# Patient Record
Sex: Male | Born: 1950 | Race: White | Hispanic: No | Marital: Single | Smoking: Never smoker
Health system: Southern US, Community
[De-identification: ages and names within clinical notes are randomized; demographics above are authoritative.]

## PROBLEM LIST (undated history)

## (undated) DIAGNOSIS — E119 Type 2 diabetes mellitus without complications: Secondary | ICD-10-CM

## (undated) DIAGNOSIS — C801 Malignant (primary) neoplasm, unspecified: Secondary | ICD-10-CM

---

## 2015-09-23 DIAGNOSIS — Z794 Long term (current) use of insulin: Secondary | ICD-10-CM

## 2015-09-23 DIAGNOSIS — E7800 Pure hypercholesterolemia, unspecified: Secondary | ICD-10-CM | POA: Insufficient documentation

## 2015-09-23 DIAGNOSIS — E118 Type 2 diabetes mellitus with unspecified complications: Secondary | ICD-10-CM | POA: Insufficient documentation

## 2015-09-23 DIAGNOSIS — I10 Essential (primary) hypertension: Secondary | ICD-10-CM | POA: Insufficient documentation

## 2016-06-22 ENCOUNTER — Ambulatory Visit
Admission: RE | Admit: 2016-06-22 | Discharge: 2016-06-22 | Disposition: A | Discharge status: Home / Self Care | Payer: Medicare Other | Source: Ambulatory Visit | Attending: Physician Assistant | Admitting: Physician Assistant

## 2016-06-22 ENCOUNTER — Other Ambulatory Visit: Payer: Self-pay | Admitting: Physician Assistant

## 2016-06-22 DIAGNOSIS — K862 Cyst of pancreas: Secondary | ICD-10-CM | POA: Insufficient documentation

## 2016-06-22 DIAGNOSIS — R945 Abnormal results of liver function studies: Secondary | ICD-10-CM

## 2016-06-22 DIAGNOSIS — K869 Disease of pancreas, unspecified: Secondary | ICD-10-CM

## 2016-06-22 DIAGNOSIS — R7989 Other specified abnormal findings of blood chemistry: Secondary | ICD-10-CM

## 2016-06-22 DIAGNOSIS — R17 Unspecified jaundice: Secondary | ICD-10-CM | POA: Diagnosis present

## 2016-06-22 DIAGNOSIS — I700 Atherosclerosis of aorta: Secondary | ICD-10-CM | POA: Insufficient documentation

## 2016-06-22 DIAGNOSIS — K7689 Other specified diseases of liver: Secondary | ICD-10-CM | POA: Diagnosis not present

## 2016-06-22 DIAGNOSIS — R918 Other nonspecific abnormal finding of lung field: Secondary | ICD-10-CM | POA: Diagnosis not present

## 2016-06-22 DIAGNOSIS — K8020 Calculus of gallbladder without cholecystitis without obstruction: Secondary | ICD-10-CM | POA: Insufficient documentation

## 2016-06-22 HISTORY — DX: Type 2 diabetes mellitus without complications: E11.9

## 2016-06-22 MED ORDER — IOPAMIDOL (ISOVUE-300) INJECTION 61%
100.0000 mL | Freq: Once | INTRAVENOUS | Status: AC | PRN
  Administered 2016-06-22: 100 mL via INTRAVENOUS

## 2016-06-26 ENCOUNTER — Encounter
Admission: RE | Admit: 2016-06-26 | Discharge: 2016-06-26 | Disposition: A | Discharge status: Home / Self Care | Payer: Medicare Other | Source: Ambulatory Visit | Attending: Physician Assistant | Admitting: Physician Assistant

## 2016-06-26 DIAGNOSIS — K869 Disease of pancreas, unspecified: Secondary | ICD-10-CM | POA: Insufficient documentation

## 2016-06-27 ENCOUNTER — Encounter: Admission: RE | Admit: 2016-06-27 | Payer: Medicare Other | Source: Ambulatory Visit

## 2016-06-27 LAB — GLUCOSE, CAPILLARY: Glucose-Capillary: 54 mg/dL — ABNORMAL LOW (ref 65–99)

## 2016-07-02 ENCOUNTER — Emergency Department
Admission: EM | Admit: 2016-07-02 | Discharge: 2016-07-02 | Disposition: A | Discharge status: Home / Self Care | Payer: Medicare Other | Attending: Emergency Medicine | Admitting: Emergency Medicine

## 2016-07-02 ENCOUNTER — Encounter: Payer: Self-pay | Admitting: Emergency Medicine

## 2016-07-02 ENCOUNTER — Other Ambulatory Visit: Payer: Self-pay | Admitting: Internal Medicine

## 2016-07-02 DIAGNOSIS — Z7984 Long term (current) use of oral hypoglycemic drugs: Secondary | ICD-10-CM | POA: Insufficient documentation

## 2016-07-02 DIAGNOSIS — R739 Hyperglycemia, unspecified: Secondary | ICD-10-CM

## 2016-07-02 DIAGNOSIS — Z794 Long term (current) use of insulin: Secondary | ICD-10-CM | POA: Diagnosis not present

## 2016-07-02 DIAGNOSIS — K869 Disease of pancreas, unspecified: Secondary | ICD-10-CM

## 2016-07-02 DIAGNOSIS — E1165 Type 2 diabetes mellitus with hyperglycemia: Secondary | ICD-10-CM | POA: Diagnosis not present

## 2016-07-02 HISTORY — DX: Malignant (primary) neoplasm, unspecified: C80.1

## 2016-07-02 LAB — URINALYSIS, COMPLETE (UACMP) WITH MICROSCOPIC
Glucose, UA: 50 mg/dL — AB
Hgb urine dipstick: NEGATIVE
KETONES UR: NEGATIVE mg/dL
Leukocytes, UA: NEGATIVE
Nitrite: NEGATIVE
PH: 5 (ref 5.0–8.0)
Protein, ur: NEGATIVE mg/dL
RBC / HPF: NONE SEEN RBC/hpf (ref 0–5)
Specific Gravity, Urine: 1.018 (ref 1.005–1.030)
Squamous Epithelial / LPF: NONE SEEN

## 2016-07-02 LAB — CBC
HEMATOCRIT: 32.9 % — AB (ref 40.0–52.0)
Hemoglobin: 11.5 g/dL — ABNORMAL LOW (ref 13.0–18.0)
MCH: 31.1 pg (ref 26.0–34.0)
MCHC: 35.1 g/dL (ref 32.0–36.0)
MCV: 88.5 fL (ref 80.0–100.0)
PLATELETS: 225 10*3/uL (ref 150–440)
RBC: 3.71 MIL/uL — ABNORMAL LOW (ref 4.40–5.90)
RDW: 15.8 % — AB (ref 11.5–14.5)
WBC: 8.8 10*3/uL (ref 3.8–10.6)

## 2016-07-02 LAB — BASIC METABOLIC PANEL
Anion gap: 10 (ref 5–15)
BUN: 25 mg/dL — AB (ref 6–20)
CHLORIDE: 98 mmol/L — AB (ref 101–111)
CO2: 23 mmol/L (ref 22–32)
CREATININE: UNDETERMINED mg/dL (ref 0.61–1.24)
Calcium: 9 mg/dL (ref 8.9–10.3)
Glucose, Bld: 362 mg/dL — ABNORMAL HIGH (ref 65–99)
Potassium: 4 mmol/L (ref 3.5–5.1)
Sodium: 131 mmol/L — ABNORMAL LOW (ref 135–145)

## 2016-07-02 LAB — GLUCOSE, CAPILLARY
GLUCOSE-CAPILLARY: 328 mg/dL — AB (ref 65–99)
GLUCOSE-CAPILLARY: 287 mg/dL — AB (ref 65–99)
GLUCOSE-CAPILLARY: 275 mg/dL — AB (ref 65–99)

## 2016-07-02 MED ORDER — INSULIN ASPART PROT & ASPART (70-30 MIX) 100 UNIT/ML ~~LOC~~ SUSP
20.0000 [IU] | SUBCUTANEOUS | Status: AC
  Administered 2016-07-02: 20 [IU] via SUBCUTANEOUS
  Filled 2016-07-02: qty 20

## 2016-07-02 MED ORDER — SODIUM CHLORIDE 0.9 % IV BOLUS (SEPSIS)
1000.0000 mL | INTRAVENOUS | Status: AC
  Administered 2016-07-02: 1000 mL via INTRAVENOUS

## 2016-07-02 NOTE — ED Triage Notes (Signed)
Pt reports recently diagnosed with pancreatic cancer. Pt reports had been having problems with hypoglycemia but now his blood sugar has been over 400. Pt is scheduled for PET scan tomorrow. Pt reports his sugar needs to be regulated so that the scan can be done.

## 2016-07-02 NOTE — ED Notes (Signed)
BS 287

## 2016-07-02 NOTE — ED Provider Notes (Signed)
[facility] Emergency Department Provider Note  ____________________________________________   First MD Initiated Contact with Patient 07/02/16 1923     (approximate)  I have reviewed the triage vital signs and the nursing notes.   HISTORY  Chief Complaint Hyperglycemia    HPI Roger Swanson is a 66 y.o. male with recent diagnosis of probable pancreatic cancer who presents for evaluation of hyperglycemia.  He received a diagnosis about a week ago but has been unable so far to get the outpatient PET scan due to glycemic control issues.  He is scheduled for tomorrow at noon, but his blood sugar today has been steadily rising and topped out in more than 400.  They are concerned that once again he will be unable to get the study if his blood sugar is too high so they came to the emergency department for evaluation after contacting his primary care doctor's office, Dr. Doy Hutching.  He has gradually been increasingly weak and fatigued over the last week.  He has no appetite.  He denies fever/chills, chest pain, shortness of breath, vomiting.  Occasionally has some abdominal discomfort and nausea.  His symptoms are moderate in severity.  Nothing in particular makes the patient's symptoms better nor worse.    He reports that he takes 70/30 insulin and that he used to take a dose in the evenings, but he was having issues with hypoglycemia so Dr. Doy Hutching took him off the evening dose.  This is the first time he has been having issues with hyperglycemia.  He takes 40 units of 70/30 in the morning.  Today when he woke up his blood sugar was about 227.  Without eating anything it went up at least 50 points within a couple of hours and then later went up to greater than 400.   Past Medical History:  Diagnosis Date  . Cancer ([place])   . Diabetes mellitus without complication ([place])     There are no active problems to display for this patient.   No past surgical history on  file.  Prior to Admission medications   Medication Sig Start Date End Date Taking? Authorizing Provider  insulin NPH-regular Human (NOVOLIN 70/30) (70-30) 100 UNIT/ML injection Inject 45 Units into the skin 2 (two) times daily before a meal. 09/27/15  Yes [provider]  metFORMIN (GLUCOPHAGE) 500 MG tablet Take 500 mg by mouth 2 (two) times daily. 04/05/16  Yes [provider]  omeprazole (PRILOSEC) 40 MG capsule Take 40 mg by mouth daily. 04/03/16  Yes [provider]    Allergies Patient has no known allergies.  No family history on file.  Social History Social History  Substance Use Topics  . Smoking status: Not on file  . Smokeless tobacco: Not on file  . Alcohol use Not on file    Review of Systems Constitutional: No fever/chills.  Hyperglycemia.  General malaise and generalized weakness Eyes: No visual changes. ENT: No sore throat. Cardiovascular: Denies chest pain. Respiratory: Denies shortness of breath. Gastrointestinal: Occasional abdominal discomfort with nausea, no vomiting.  No diarrhea.  No constipation.  Minimal appetite. Genitourinary: Negative for dysuria. Musculoskeletal: Negative for neck pain.  Negative for back pain. Integumentary: Jaundice.  Negative for rash. Neurological: Negative for headaches, focal weakness or numbness.   ____________________________________________   PHYSICAL EXAM:  VITAL SIGNS: ED Triage Vitals  Enc Vitals Group     BP 07/02/16 1720 (!) 162/72     Pulse Rate 07/02/16 1720 (!) 101  Resp 07/02/16 1720 18     Temp 07/02/16 1720 98 F (36.7 C)     Temp Source 07/02/16 1720 Oral     SpO2 07/02/16 1720 100 %     Weight 07/02/16 1721 91.6 kg (202 lb)     Height 07/02/16 1721 1.854 m (6\' 1" )     Head Circumference --      Peak Flow --      Pain Score --      Pain Loc --      Pain Edu? --      Excl. in [place]? --     Constitutional: Alert and oriented. Obvious jaundice but in no acute  distress Eyes: Scleral icterus Head: Atraumatic. Cardiovascular: Normal rate, regular rhythm. Good peripheral circulation.  Respiratory: Normal respiratory effort.  No retractions.  Musculoskeletal: No lower extremity tenderness nor edema. No gross deformities of extremities. Neurologic:  Normal speech and language. No gross focal neurologic deficits are appreciated.  Skin:  Skin is warm, dry and intact. No rash noted. Psychiatric: Mood and affect are normal. Speech and behavior are normal.  ____________________________________________   LABS (all labs ordered are listed, but only abnormal results are displayed)  Labs Reviewed  BASIC METABOLIC PANEL - Abnormal; Notable for the following:       Result Value   Sodium 131 (*)    Chloride 98 (*)    Glucose, Bld 362 (*)    BUN 25 (*)    All other components within normal limits  CBC - Abnormal; Notable for the following:    RBC 3.71 (*)    Hemoglobin 11.5 (*)    HCT 32.9 (*)    RDW 15.8 (*)    All other components within normal limits  URINALYSIS, COMPLETE (UACMP) WITH MICROSCOPIC - Abnormal; Notable for the following:    Color, Urine AMBER (*)    APPearance HAZY (*)    Glucose, UA 50 (*)    Bilirubin Urine MODERATE (*)    Bacteria, UA RARE (*)    All other components within normal limits  GLUCOSE, CAPILLARY - Abnormal; Notable for the following:    Glucose-Capillary 328 (*)    All other components within normal limits  GLUCOSE, CAPILLARY - Abnormal; Notable for the following:    Glucose-Capillary 287 (*)    All other components within normal limits  GLUCOSE, CAPILLARY - Abnormal; Notable for the following:    Glucose-Capillary 275 (*)    All other components within normal limits  CBG MONITORING, ED   ____________________________________________  EKG  None - EKG not ordered by ED physician ____________________________________________  RADIOLOGY   No results  found.  ____________________________________________   PROCEDURES  Critical Care performed: No   Procedure(s) performed:   Procedures   ____________________________________________   INITIAL IMPRESSION / ASSESSMENT AND PLAN / ED COURSE  Pertinent labs & imaging results that were available during my care of the patient were reviewed by me and considered in my medical decision making (see chart for details).  The patient has no acute or emergent complications of hyperglycemia based on his lab work.  I verified with radiology that it is true that they cannot perform a PET scan with a blood sugar more than the upper 200s, and lower 200s is better because of the glycemic analogue used for the tracer.  Radiologist also verified that in general PET scan's are not performed as an inpatient study so bring him into the hospital for sliding scale insulin would not be advisable.  Regardless, he does not meet any inpatient criteria.  I will discuss this situation by phone with Dr. Doy Hutching for additional management recommendations, but I will suggest a liter of IV fluids and a smaller, perhaps one half regular dose of his 70/30 insulin to try to optimize him for tomorrow study.   Clinical Course as of Jul 03 2322  Mon Jul 02, 2016  2000 Discussed by phone with Dr. Doy Hutching.  He agreed with my plan for some insulin tonight and IV fluids.  He recommended 20 units of 70/30 insulin in addition to the liter of IV fluids.  He provided a number at which the patient and family can contact him at 7:45 AM tomorrow.  The patient has until 8 AM to take additional insulin to control his blood sugar and Dr. Doy Hutching will be sure and be available tomorrow to work with them to try to make sure he is able to get his PET scan.  I updated the patient and his family and they agree with this plan and are comfortable with that.  [CF]    Clinical Course User Index [CF] Hinda Kehr, MD     ____________________________________________  FINAL CLINICAL IMPRESSION(S) / ED DIAGNOSES  Final diagnoses:  Hyperglycemia     MEDICATIONS GIVEN DURING THIS VISIT:  Medications  sodium chloride 0.9 % bolus 1,000 mL (0 mLs Intravenous Stopped 07/02/16 2220)  insulin aspart protamine- aspart (NOVOLOG MIX 70/30) injection 20 Units (20 Units Subcutaneous Given 07/02/16 2128)     NEW OUTPATIENT MEDICATIONS STARTED DURING THIS VISIT:  Discharge Medication List as of 07/02/2016 10:01 PM      Discharge Medication List as of 07/02/2016 10:01 PM      Discharge Medication List as of 07/02/2016 10:01 PM       Note:  This document was prepared using Dragon voice recognition software and may include unintentional dictation errors.    Hinda Kehr, MD 07/02/16 2324

## 2016-07-02 NOTE — ED Notes (Signed)
Pt states that he needs to get his blood sugar under control. Has a PET scan tomm and BS needs to be lower. Family at bedside.

## 2016-07-02 NOTE — ED Notes (Signed)
BS 275

## 2016-07-02 NOTE — Discharge Instructions (Signed)
As we discussed, though your blood sugar is running high, it is not dangerous at this time.  We understand the concern, however, that an elevated blood glucose level may prevent you from getting your PET scan tomorrow.  As we discussed, Dr. Doy Hutching and I came up with a plan for you to receive half of your regular dose of 70/30 insulin tonight.  Check your fingerstick blood glucose level in the morning and call Dr. Doy Hutching at the number listed above at about 7:45 AM.  He will work with you based on your morning number to determine if you need any additional insulin or other treatment.

## 2016-07-02 NOTE — ED Notes (Signed)
Pt signed E-Signature.

## 2016-07-03 ENCOUNTER — Encounter
Admission: RE | Admit: 2016-07-03 | Discharge: 2016-07-03 | Disposition: A | Discharge status: Home / Self Care | Payer: Medicare Other | Source: Ambulatory Visit | Attending: Physician Assistant | Admitting: Physician Assistant

## 2016-07-03 DIAGNOSIS — K869 Disease of pancreas, unspecified: Secondary | ICD-10-CM

## 2016-07-03 LAB — GLUCOSE, CAPILLARY: Glucose-Capillary: 160 mg/dL — ABNORMAL HIGH (ref 65–99)

## 2016-07-03 MED ORDER — FLUDEOXYGLUCOSE F - 18 (FDG) INJECTION
12.9700 | Freq: Once | INTRAVENOUS | Status: AC | PRN
  Administered 2016-07-03: 12.97 via INTRAVENOUS

## 2016-07-04 ENCOUNTER — Other Ambulatory Visit: Payer: Self-pay | Admitting: *Deleted

## 2016-07-05 DIAGNOSIS — K8689 Other specified diseases of pancreas: Secondary | ICD-10-CM | POA: Insufficient documentation
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
## 2016-07-06 ENCOUNTER — Inpatient Hospital Stay: Payer: Medicare Other | Attending: Oncology | Admitting: Oncology

## 2016-07-06 ENCOUNTER — Encounter: Payer: Self-pay | Admitting: Oncology

## 2016-07-06 ENCOUNTER — Inpatient Hospital Stay: Payer: Medicare Other

## 2016-07-06 ENCOUNTER — Telehealth: Payer: Self-pay

## 2016-07-06 ENCOUNTER — Other Ambulatory Visit: Payer: Self-pay

## 2016-07-06 VITALS — BP 129/79 | HR 99 | Temp 97.0°F | Ht 73.0 in | Wt 207.7 lb

## 2016-07-06 DIAGNOSIS — Z794 Long term (current) use of insulin: Secondary | ICD-10-CM | POA: Insufficient documentation

## 2016-07-06 DIAGNOSIS — C7800 Secondary malignant neoplasm of unspecified lung: Secondary | ICD-10-CM | POA: Insufficient documentation

## 2016-07-06 DIAGNOSIS — E119 Type 2 diabetes mellitus without complications: Secondary | ICD-10-CM | POA: Diagnosis not present

## 2016-07-06 DIAGNOSIS — K869 Disease of pancreas, unspecified: Secondary | ICD-10-CM | POA: Insufficient documentation

## 2016-07-06 DIAGNOSIS — K8689 Other specified diseases of pancreas: Secondary | ICD-10-CM

## 2016-07-06 DIAGNOSIS — Z79899 Other long term (current) drug therapy: Secondary | ICD-10-CM | POA: Insufficient documentation

## 2016-07-06 DIAGNOSIS — C779 Secondary and unspecified malignant neoplasm of lymph node, unspecified: Secondary | ICD-10-CM | POA: Diagnosis not present

## 2016-07-06 DIAGNOSIS — R978 Other abnormal tumor markers: Secondary | ICD-10-CM | POA: Insufficient documentation

## 2016-07-06 DIAGNOSIS — C252 Malignant neoplasm of tail of pancreas: Secondary | ICD-10-CM | POA: Insufficient documentation

## 2016-07-06 DIAGNOSIS — R188 Other ascites: Secondary | ICD-10-CM | POA: Insufficient documentation

## 2016-07-06 DIAGNOSIS — K8020 Calculus of gallbladder without cholecystitis without obstruction: Secondary | ICD-10-CM | POA: Insufficient documentation

## 2016-07-06 DIAGNOSIS — I700 Atherosclerosis of aorta: Secondary | ICD-10-CM | POA: Insufficient documentation

## 2016-07-06 DIAGNOSIS — C787 Secondary malignant neoplasm of liver and intrahepatic bile duct: Secondary | ICD-10-CM | POA: Diagnosis not present

## 2016-07-06 DIAGNOSIS — C259 Malignant neoplasm of pancreas, unspecified: Secondary | ICD-10-CM | POA: Diagnosis not present

## 2016-07-06 DIAGNOSIS — Z7189 Other specified counseling: Secondary | ICD-10-CM

## 2016-07-06 LAB — CBC WITH DIFFERENTIAL/PLATELET
Basophils Absolute: 0.1 10*3/uL (ref 0–0.1)
Basophils Relative: 1 %
EOS PCT: 5 %
Eosinophils Absolute: 0.6 10*3/uL (ref 0–0.7)
HCT: 36.8 % — ABNORMAL LOW (ref 40.0–52.0)
Hemoglobin: 12.4 g/dL — ABNORMAL LOW (ref 13.0–18.0)
LYMPHS ABS: 0.8 10*3/uL — AB (ref 1.0–3.6)
LYMPHS PCT: 6 %
MCH: 29.8 pg (ref 26.0–34.0)
MCHC: 33.6 g/dL (ref 32.0–36.0)
MCV: 88.7 fL (ref 80.0–100.0)
MONO ABS: 0.8 10*3/uL (ref 0.2–1.0)
Monocytes Relative: 6 %
Neutro Abs: 10.5 10*3/uL — ABNORMAL HIGH (ref 1.4–6.5)
Neutrophils Relative %: 82 %
Platelets: 294 10*3/uL (ref 150–440)
RBC: 4.15 MIL/uL — ABNORMAL LOW (ref 4.40–5.90)
RDW: 16.6 % — AB (ref 11.5–14.5)
WBC: 12.8 10*3/uL — ABNORMAL HIGH (ref 3.8–10.6)

## 2016-07-06 LAB — COMPREHENSIVE METABOLIC PANEL
ALT: 125 U/L — AB (ref 17–63)
AST: 167 U/L — ABNORMAL HIGH (ref 15–41)
Albumin: 2.6 g/dL — ABNORMAL LOW (ref 3.5–5.0)
Alkaline Phosphatase: 638 U/L — ABNORMAL HIGH (ref 38–126)
Anion gap: 10 (ref 5–15)
BILIRUBIN TOTAL: 27.1 mg/dL — AB (ref 0.3–1.2)
BUN: 24 mg/dL — ABNORMAL HIGH (ref 6–20)
CHLORIDE: 103 mmol/L (ref 101–111)
CO2: 22 mmol/L (ref 22–32)
Calcium: 9 mg/dL (ref 8.9–10.3)
Glucose, Bld: 187 mg/dL — ABNORMAL HIGH (ref 65–99)
POTASSIUM: 4.1 mmol/L (ref 3.5–5.1)
Sodium: 135 mmol/L (ref 135–145)
TOTAL PROTEIN: 7.6 g/dL (ref 6.5–8.1)

## 2016-07-06 MED ORDER — ONDANSETRON HCL 8 MG PO TABS: 8.0000 mg | ORAL_TABLET | Freq: Two times a day (BID) | ORAL | 2 refills | Status: AC

## 2016-07-06 MED ORDER — LIDOCAINE-PRILOCAINE 2.5-2.5 % EX CREA: 1.0000 "application " | TOPICAL_CREAM | CUTANEOUS | 3 refills | Status: DC | PRN

## 2016-07-06 MED ORDER — PROCHLORPERAZINE MALEATE 10 MG PO TABS: 10.0000 mg | ORAL_TABLET | Freq: Four times a day (QID) | ORAL | 2 refills | Status: DC | PRN

## 2016-07-06 MED ORDER — ALPRAZOLAM 0.25 MG PO TABS: 0.2500 mg | ORAL_TABLET | Freq: Every evening | ORAL | 2 refills | Status: AC | PRN

## 2016-07-06 NOTE — Telephone Encounter (Signed)
  Oncology Nurse Navigator Documentation Notified Roger Swanson about GI appointment at [facility] Monday, 6/18, at 1:00pm. Notified of 27.1 Bilirubin and per Dr. Grayland Ormond if he has any new onset of symptoms, including but not limited to increased confusion, fatigue, drowsiness, abdominal pain to go to the ED right away. Verbalized understanding. Navigator Location: [facility] (07/06/16 1200)   )Navigator Encounter Type: Telephone;Diagnostic Results (07/06/16 1200)                                                    Time Spent with Patient: 15 (07/06/16 1200)

## 2016-07-06 NOTE — Progress Notes (Signed)
Patient here for initial consult he has expressed anxiety over diagnosis and treatment.

## 2016-07-06 NOTE — Progress Notes (Signed)
  Oncology Nurse Navigator Documentation Received call back from [facility]. They do not currently have a physician that performs ERCP. Dr. Grayland Ormond notified and referral changed to [facility]. Voicemail left with [facility] to return call regarding. Order placed. Judy notified. Navigator Location: (P) [facility] (07/06/16 1300)   )Navigator Encounter Type: (P) Telephone (07/06/16 1300)

## 2016-07-06 NOTE — Progress Notes (Signed)
START OFF PATHWAY REGIMEN - Pancreatic   OFF00015:Gemcitabine 1,000 mg/m2 Days 1, 8, 15 q28 Days:   A cycle is every 28 days (3 weeks on and 1 week off):     Gemcitabine   **Always confirm dose/schedule in your pharmacy ordering system**    Patient Characteristics: Adenocarcinoma, Metastatic Disease, First Line, PS = 0, 1 Histology: Adenocarcinoma Current evidence of distant metastases? Yes AJCC T Category: TX AJCC N Category: NX AJCC M Category: M1 AJCC 8 Stage Grouping: IV Line of therapy: First Line Would you be surprised if this patient died  in the next year? I would NOT be surprised if this patient died in the next year Intent of Therapy: Non-Curative / Palliative Intent, Discussed with Patient

## 2016-07-06 NOTE — Progress Notes (Signed)
  Oncology Nurse Navigator Documentation Met with Roger Swanson and his family during and after consult with Dr. Grayland Ormond. Introduced Therapist, nutritional and provided my contact information for future needs. Plan of care includes U/S guided liver biopsy, chemo class, port a cath, GI referral and labs today.  Navigator Location: [facility] (07/06/16 0900)   )Navigator Encounter Type: Initial MedOnc (07/06/16 0900)                     Patient Visit Type: MedOnc;Initial (07/06/16 0900) Treatment Phase: Pre-Tx/Tx Discussion (07/06/16 0900) Barriers/Navigation Needs: No barriers at this time;Education (07/06/16 0900)                Acuity: Level 2 (07/06/16 0900)   Acuity Level 2: Initial guidance, education and coordination as needed;Educational needs;Ongoing guidance and education throughout treatment as needed;Assistance expediting appointments (07/06/16 0900)     Time Spent with Patient: 60 (07/06/16 0900)

## 2016-07-07 LAB — CANCER ANTIGEN 19-9: CA 19 9: 28287 U/mL — AB (ref 0–35)

## 2016-07-09 ENCOUNTER — Encounter: Payer: Self-pay | Admitting: Gastroenterology

## 2016-07-09 ENCOUNTER — Encounter: Payer: Self-pay | Admitting: *Deleted

## 2016-07-09 ENCOUNTER — Other Ambulatory Visit: Payer: Self-pay

## 2016-07-09 ENCOUNTER — Ambulatory Visit (INDEPENDENT_AMBULATORY_CARE_PROVIDER_SITE_OTHER): Payer: Medicare Other | Admitting: Gastroenterology

## 2016-07-09 ENCOUNTER — Other Ambulatory Visit: Payer: Self-pay | Admitting: *Deleted

## 2016-07-09 ENCOUNTER — Telehealth: Payer: Self-pay

## 2016-07-09 VITALS — BP 130/66 | HR 97 | Temp 97.7°F | Ht 73.0 in | Wt 209.5 lb

## 2016-07-09 DIAGNOSIS — E113599 Type 2 diabetes mellitus with proliferative diabetic retinopathy without macular edema, unspecified eye: Secondary | ICD-10-CM | POA: Insufficient documentation

## 2016-07-09 DIAGNOSIS — K869 Disease of pancreas, unspecified: Secondary | ICD-10-CM

## 2016-07-09 DIAGNOSIS — K8689 Other specified diseases of pancreas: Secondary | ICD-10-CM
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
## 2016-07-09 NOTE — Telephone Encounter (Signed)
Ok, thank you. Keep me posted.

## 2016-07-09 NOTE — Telephone Encounter (Signed)
  Oncology Nurse Navigator Documentation Received call from friend Bethena Roys. Mr. Gasper is scheduled now for ERCP with Dr. Allen Norris 6-19. We will reschedule chemo class from 6/19 to likely 6/20. She would like to cancel port a cath for this week as she feels he is to weak to undergo all of the scheduled procedures. I will notify Dr. Grayland Ormond regarding. She will keep biopsy for 6/21 as scheduled. Navigator Location: [facility] (07/09/16 1600)   )Navigator Encounter Type: Telephone (07/09/16 1600)                                                    Time Spent with Patient: 15 (07/09/16 1600)

## 2016-07-10 ENCOUNTER — Ambulatory Visit: Payer: Medicare Other | Admitting: Anesthesiology

## 2016-07-10 ENCOUNTER — Telehealth: Payer: Self-pay

## 2016-07-10 ENCOUNTER — Other Ambulatory Visit (INDEPENDENT_AMBULATORY_CARE_PROVIDER_SITE_OTHER): Payer: Self-pay | Admitting: Vascular Surgery

## 2016-07-10 ENCOUNTER — Encounter
Admission: RE | Disposition: A | Discharge status: Home / Self Care | Payer: Self-pay | Source: Ambulatory Visit | Attending: Gastroenterology

## 2016-07-10 ENCOUNTER — Inpatient Hospital Stay: Payer: Medicare Other

## 2016-07-10 ENCOUNTER — Ambulatory Visit
Admission: RE | Admit: 2016-07-10 | Discharge: 2016-07-10 | Disposition: A | Discharge status: Home / Self Care | Payer: Medicare Other | Source: Ambulatory Visit | Attending: Gastroenterology | Admitting: Gastroenterology

## 2016-07-10 ENCOUNTER — Encounter: Payer: Self-pay | Admitting: Anesthesiology

## 2016-07-10 ENCOUNTER — Other Ambulatory Visit: Payer: Self-pay | Admitting: General Surgery

## 2016-07-10 ENCOUNTER — Ambulatory Visit: Payer: Medicare Other

## 2016-07-10 DIAGNOSIS — Z794 Long term (current) use of insulin: Secondary | ICD-10-CM | POA: Diagnosis not present

## 2016-07-10 DIAGNOSIS — K269 Duodenal ulcer, unspecified as acute or chronic, without hemorrhage or perforation: Secondary | ICD-10-CM | POA: Diagnosis not present

## 2016-07-10 DIAGNOSIS — E119 Type 2 diabetes mellitus without complications: Secondary | ICD-10-CM | POA: Insufficient documentation

## 2016-07-10 DIAGNOSIS — C259 Malignant neoplasm of pancreas, unspecified: Secondary | ICD-10-CM | POA: Insufficient documentation

## 2016-07-10 DIAGNOSIS — K2980 Duodenitis without bleeding: Secondary | ICD-10-CM | POA: Insufficient documentation

## 2016-07-10 DIAGNOSIS — K8689 Other specified diseases of pancreas: Secondary | ICD-10-CM

## 2016-07-10 DIAGNOSIS — K831 Obstruction of bile duct: Secondary | ICD-10-CM | POA: Diagnosis not present

## 2016-07-10 HISTORY — PX: ERCP: SHX5425

## 2016-07-10 LAB — GLUCOSE, CAPILLARY
GLUCOSE-CAPILLARY: 40 mg/dL — AB (ref 65–99)
GLUCOSE-CAPILLARY: 63 mg/dL — AB (ref 65–99)
GLUCOSE-CAPILLARY: 95 mg/dL (ref 65–99)
GLUCOSE-CAPILLARY: 94 mg/dL (ref 65–99)

## 2016-07-10 SURGERY — ERCP, WITH INTERVENTION IF INDICATED
Anesthesia: General

## 2016-07-10 MED ORDER — MIDAZOLAM HCL 2 MG/2ML IJ SOLN
INTRAMUSCULAR | Status: DC | PRN
Start: 2016-07-10 — End: 2016-07-10
  Administered 2016-07-10: 1 mg via INTRAVENOUS

## 2016-07-10 MED ORDER — DEXTROSE 50 % IV SOLN
25.0000 mL | Freq: Once | INTRAVENOUS | Status: AC
  Administered 2016-07-10: 25 mL via INTRAVENOUS

## 2016-07-10 MED ORDER — DEXTROSE 50 % IV SOLN
INTRAVENOUS | Status: AC
  Administered 2016-07-10: 25 mL via INTRAVENOUS
  Filled 2016-07-10: qty 50

## 2016-07-10 MED ORDER — LIDOCAINE HCL (PF) 1 % IJ SOLN
2.0000 mL | Freq: Once | INTRAMUSCULAR | Status: AC
  Administered 2016-07-10: 0.3 mL via INTRADERMAL
  Filled 2016-07-10: qty 2

## 2016-07-10 MED ORDER — FENTANYL CITRATE (PF) 100 MCG/2ML IJ SOLN
INTRAMUSCULAR | Status: DC | PRN
  Administered 2016-07-10: 25 ug via INTRAVENOUS

## 2016-07-10 MED ORDER — FENTANYL CITRATE (PF) 100 MCG/2ML IJ SOLN
INTRAMUSCULAR | Status: AC
  Filled 2016-07-10: qty 2

## 2016-07-10 MED ORDER — GLUCAGON HCL RDNA (DIAGNOSTIC) 1 MG IJ SOLR
INTRAMUSCULAR | Status: AC
  Filled 2016-07-10: qty 1

## 2016-07-10 MED ORDER — MIDAZOLAM HCL 2 MG/2ML IJ SOLN
INTRAMUSCULAR | Status: AC
  Filled 2016-07-10: qty 2

## 2016-07-10 MED ORDER — PROPOFOL 10 MG/ML IV BOLUS
INTRAVENOUS | Status: DC | PRN
  Administered 2016-07-10: 60 mg via INTRAVENOUS

## 2016-07-10 MED ORDER — LIDOCAINE HCL (PF) 2 % IJ SOLN
INTRAMUSCULAR | Status: AC
  Filled 2016-07-10: qty 2

## 2016-07-10 MED ORDER — SODIUM CHLORIDE 0.9 % IV SOLN
INTRAVENOUS | Status: DC
  Administered 2016-07-10: 1000 mL via INTRAVENOUS

## 2016-07-10 MED ORDER — PROPOFOL 500 MG/50ML IV EMUL
INTRAVENOUS | Status: DC | PRN
  Administered 2016-07-10: 160 ug/kg/min via INTRAVENOUS

## 2016-07-10 MED ORDER — PROPOFOL 500 MG/50ML IV EMUL
INTRAVENOUS | Status: AC
  Filled 2016-07-10: qty 50

## 2016-07-10 MED ORDER — GLYCOPYRROLATE 0.2 MG/ML IJ SOLN
INTRAMUSCULAR | Status: DC | PRN
  Administered 2016-07-10: 0.2 mg via INTRAVENOUS

## 2016-07-10 MED ORDER — GLUCAGON HCL (RDNA) 1 MG IJ SOLR
INTRAMUSCULAR | Status: DC | PRN
  Administered 2016-07-10: 1 mg via INTRAVENOUS

## 2016-07-10 MED ORDER — INDOMETHACIN 50 MG RE SUPP
100.0000 mg | Freq: Once | RECTAL | Status: AC
  Administered 2016-07-10: 100 mg via RECTAL

## 2016-07-10 MED ORDER — LIDOCAINE HCL (CARDIAC) 20 MG/ML IV SOLN
INTRAVENOUS | Status: DC | PRN
  Administered 2016-07-10: 80 mg via INTRAVENOUS

## 2016-07-10 NOTE — Op Note (Signed)
[facility] Gastroenterology Patient Name: Roger Swanson Procedure Date: 07/10/2016 12:58 PM MRN: [id number] Account #: [id number] Date of Birth: April 05, 1950 Admit Type: Outpatient Age: 66 Room: [facility] ENDO ROOM 4 Gender: Male Note Status: Finalized Procedure:            ERCP Indications:          Jaundice Providers:            Lucilla Lame MD, MD Referring MD:         Kathlene November. Grayland Ormond, MD (Referring MD) Medicines:            Propofol per Anesthesia Complications:        No immediate complications. Procedure:            Pre-Anesthesia Assessment:                       - Prior to the procedure, a History and Physical was                        performed, and patient medications and allergies were                        reviewed. The patient's tolerance of previous                        anesthesia was also reviewed. The risks and benefits of                        the procedure and the sedation options and risks were                        discussed with the patient. All questions were                        answered, and informed consent was obtained. Prior                        Anticoagulants: The patient has taken no previous                        anticoagulant or antiplatelet agents. ASA Grade                        Assessment: II - A patient with mild systemic disease.                        After reviewing the risks and benefits, the patient was                        deemed in satisfactory condition to undergo the                        procedure.                       After obtaining informed consent, the scope was passed                        under direct vision. Throughout the procedure, the  patient's blood pressure, pulse, and oxygen saturations                        were monitored continuously. The ERCP was introduced                        through the mouth, and used to inject contrast into and                        used to  inject contrast into the bile duct and ventral                        pancreatic duct. The ERCP was technically difficult and                        complex due to challenging cannulation. The patient                        tolerated the procedure well. Findings:      The scout film was normal. The esophagus was successfully intubated       under direct vision. The scope was advanced to a normal major papilla in       the descending duodenum without detailed examination of the pharynx,       larynx and associated structures, and upper GI tract. The upper GI tract       was grossly normal. The bile duct was deeply cannulated with the       short-nosed traction sphincterotome. Contrast was injected. I personally       interpreted the bile duct images. There was brisk flow of contrast       through the ducts. Image quality was excellent. Contrast extended to the       bifurcation. The right main hepatic duct contained a single segmental       stenosis. A wire was passed into the biliary tree. Biliary       sphincterotomy was made with a traction (standard) sphincterotome using       ERBE electrocautery. There was no post-sphincterotomy bleeding. One 10       Fr by 7 cm plastic stent with a single external flap and a single       internal flap was placed 5 cm into the common bile duct. Bile flowed       through the stent. The stent was in good position. 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                       - A biliary sphincterotomy was performed.                       - One plastic stent was placed into the common bile                        duct. Recommendation:       - Watch for pancreatitis, bleeding, perforation, and  cholangitis.                       - Clear liquid diet today.                       - Repeat ERCP in 3 months to remove stent. Procedure Code(s):    --- Professional  ---                       [phone], Endoscopic retrograde cholangiopancreatography                        (ERCP); with placement of endoscopic stent into biliary                        or pancreatic duct, including pre- and post-dilation                        and guide wire passage, when performed, including                        sphincterotomy, when performed, each stent                       06770, Endoscopic catheterization of the biliary ductal                        system, radiological supervision and interpretation Diagnosis Code(s):    --- Professional ---                       K83.1, Obstruction of bile duct                       R17, Unspecified jaundice CPT copyright 2016 American Medical Association. All rights reserved. The codes documented in this report are preliminary and upon coder review may  be revised to meet current compliance requirements. Lucilla Lame MD, MD 07/10/2016 2:04:49 PM This report has been signed electronically. Number of Addenda: 0 Note Initiated On: 07/10/2016 12:58 PM      [facility]

## 2016-07-10 NOTE — Telephone Encounter (Signed)
  Oncology Nurse Navigator Documentation Spoke with friend Bethena Roys. Chemo class will be scheduled 6/26 followed by lab,md,chemo at 1:30 Navigator Location: [facility] (07/10/16 1400)   )Navigator Encounter Type: Telephone (07/10/16 1400)                                                    Time Spent with Patient: 15 (07/10/16 1400)

## 2016-07-10 NOTE — Anesthesia Preprocedure Evaluation (Signed)
Anesthesia Evaluation  Patient identified by MRN, date of birth, ID band Patient awake    Reviewed: Allergy & Precautions, H&P , NPO status , Patient's Chart, lab work & pertinent test results, reviewed documented beta blocker date and time   History of Anesthesia Complications Negative for: history of anesthetic complications  Airway Mallampati: I  TM Distance: >3 FB Neck ROM: full    Dental  (+) Caps, Chipped, Teeth Intact, Dental Advidsory Given   Pulmonary neg pulmonary ROS,           Cardiovascular Exercise Tolerance: Good negative cardio ROS       Neuro/Psych negative neurological ROS  negative psych ROS   GI/Hepatic negative GI ROS, Neg liver ROS,   Endo/Other  diabetes, Insulin Dependent  Renal/GU negative Renal ROS  negative genitourinary   Musculoskeletal   Abdominal   Peds  Hematology negative hematology ROS (+)   Anesthesia Other Findings Past Medical History: No date: Cancer ([place]) No date: Diabetes mellitus without complication ([facility])   Reproductive/Obstetrics negative OB ROS                             Anesthesia Physical Anesthesia Plan  ASA: II  Anesthesia Plan: General   Post-op Pain Management:    Induction: Intravenous  PONV Risk Score and Plan: 2 and Propofol  Airway Management Planned: Natural Airway and Nasal Cannula  Additional Equipment:   Intra-op Plan:   Post-operative Plan:   Informed Consent: I have reviewed the patients History and Physical, chart, labs and discussed the procedure including the risks, benefits and alternatives for the proposed anesthesia with the patient or authorized representative who has indicated his/her understanding and acceptance.   Dental Advisory Given  Plan Discussed with: Anesthesiologist, CRNA and Surgeon  Anesthesia Plan Comments:         Anesthesia Quick Evaluation

## 2016-07-10 NOTE — Anesthesia Postprocedure Evaluation (Signed)
Anesthesia Post Note  Patient: Roger Swanson  Procedure(s) Performed: Procedure(s) (LRB): ENDOSCOPIC RETROGRADE CHOLANGIOPANCREATOGRAPHY (ERCP) (N/A)  Patient location during evaluation: PACU Anesthesia Type: General Level of consciousness: awake and alert and oriented Pain management: pain level controlled Vital Signs Assessment: post-procedure vital signs reviewed and stable Respiratory status: spontaneous breathing Cardiovascular status: blood pressure returned to baseline Anesthetic complications: no     Last Vitals:  Vitals:   07/10/16 1431 07/10/16 1441  BP: 128/75 135/71  Pulse: 94 94  Resp: 16 16  Temp:      Last Pain:  Vitals:   07/10/16 1411  TempSrc: Tympanic  PainSc:                  Danaysha Kirn

## 2016-07-10 NOTE — Telephone Encounter (Signed)
  Oncology Nurse Navigator Documentation [facility] notified to cancel port placement at this time due to patient condition. We will call to reschedule. Navigator Location: [facility] (07/10/16 0900)   )Navigator Encounter Type: Telephone (07/10/16 0900)                                                    Time Spent with Patient: 15 (07/10/16 0900)

## 2016-07-10 NOTE — H&P (Signed)
   Roger Lame, MD [facility] [address], [place], [place] [address] Phone:[phone] Fax : [phone]  Primary Care Physician:  Idelle Crouch, MD Primary Gastroenterologist:  Dr. Allen Norris  Pre-Procedure History & Physical: HPI:  Roger Swanson is a 66 y.o. male is here for an ERCP.   Past Medical History:  Diagnosis Date  . Cancer ([place])   . Diabetes mellitus without complication ([place])     No past surgical history on file.  Prior to Admission medications   Medication Sig Start Date End Date Taking? Authorizing Provider  ALPRAZolam (XANAX) 0.25 MG tablet Take 1 tablet (0.25 mg total) by mouth at bedtime as needed for anxiety. 07/06/16   Lloyd Huger, MD  insulin NPH-regular Human (NOVOLIN 70/30) (70-30) 100 UNIT/ML injection Inject 30 Units into the skin 2 (two) times daily before a meal.  09/27/15   [provider]  lidocaine-prilocaine (EMLA) cream Apply 1 application topically as needed. 07/06/16   Lloyd Huger, MD  ondansetron (ZOFRAN) 8 MG tablet Take 1 tablet (8 mg total) by mouth 2 (two) times daily. Patient not taking: Reported on 07/09/2016 07/06/16   Lloyd Huger, MD  prochlorperazine (COMPAZINE) 10 MG tablet Take 1 tablet (10 mg total) by mouth every 6 (six) hours as needed (Nausea or vomiting). 07/06/16   Lloyd Huger, MD    Allergies as of 07/09/2016  . (No Known Allergies)    No family history on file.  Social History   Social History  . Marital status: Married    Spouse name: N/A  . Number of children: N/A  . Years of education: N/A   Occupational History  . Not on file.   Social History Main Topics  . Smoking status: Never Smoker  . Smokeless tobacco: Never Used  . Alcohol use No  . Drug use: No  . Sexual activity: Not on file   Other Topics Concern  . Not on file   Social History Narrative  . No narrative on file    Review of Systems: See HPI, otherwise negative ROS  Physical Exam: BP 125/68    Pulse 93   Temp 97 F (36.1 C) (Tympanic)   Resp 17   Ht 6\' 1"  (1.854 m)   Wt 207 lb (93.9 kg)   SpO2 100%   BMI 27.31 kg/m  General:   Alert,  pleasant and cooperative in NAD Head:  Normocephalic and atraumatic. Neck:  Supple; no masses or thyromegaly. Lungs:  Clear throughout to auscultation.    Heart:  Regular rate and rhythm. Abdomen:  Soft, nontender and nondistended. Normal bowel sounds, without guarding, and without rebound.   Neurologic:  Alert and  oriented x4;  grossly normal neurologically.  Impression/Plan: KOICHI Swanson is here for an ERCP to be performed for jaundice with pancreatic cancer.  Risks, benefits, limitations, and alternatives regarding  ERCP have been reviewed with the patient.  Questions have been answered.  All parties agreeable.   Roger Lame, MD  07/10/2016, 11:54 AM

## 2016-07-10 NOTE — Anesthesia Post-op Follow-up Note (Cosign Needed)
Anesthesia QCDR form completed.        

## 2016-07-10 NOTE — Transfer of Care (Signed)
Immediate Anesthesia Transfer of Care Note  Patient: Roger Swanson  Procedure(s) Performed: Procedure(s): ENDOSCOPIC RETROGRADE CHOLANGIOPANCREATOGRAPHY (ERCP) (N/A)  Patient Location: PACU  Anesthesia Type:General  Level of Consciousness: awake, alert  and oriented  Airway & Oxygen Therapy: Patient Spontanous Breathing and Patient connected to nasal cannula oxygen  Post-op Assessment: Report given to RN and Post -op Vital signs reviewed and stable  Post vital signs: Reviewed and stable  Last Vitals:  Vitals:   07/10/16 1400 07/10/16 1411  BP: (P) 121/69 121/69  Pulse: (P) 94 94  Resp: (P) 16 (!) 26  Temp: (!) (P) 35.9 C (!) 35.9 C    Last Pain:  Vitals:   07/10/16 1411  TempSrc: Tympanic  PainSc:          Complications: No apparent anesthesia complications

## 2016-07-10 NOTE — Patient Instructions (Signed)

## 2016-07-11 ENCOUNTER — Other Ambulatory Visit: Payer: Self-pay | Admitting: Radiology

## 2016-07-11 ENCOUNTER — Encounter: Admission: RE | Payer: Self-pay | Source: Ambulatory Visit

## 2016-07-11 ENCOUNTER — Encounter: Payer: Self-pay | Admitting: Gastroenterology

## 2016-07-11 ENCOUNTER — Ambulatory Visit: Admission: RE | Admit: 2016-07-11 | Payer: Medicare Other | Source: Ambulatory Visit | Admitting: Vascular Surgery

## 2016-07-11 SURGERY — PORTA CATH INSERTION
Anesthesia: Moderate Sedation

## 2016-07-11 NOTE — OR Nursing (Signed)
Pt. Is having abdominal pain per wife .He has this pain frequently and I instructed wife to call if there is any change. STOMACH IS SOFT AND THERE IS NO TEMPERTATURE. I instructed wife to call if any further problems.To see cancer MD.tommorow for Bx's.

## 2016-07-12 ENCOUNTER — Telehealth: Payer: Self-pay | Admitting: *Deleted

## 2016-07-12 ENCOUNTER — Other Ambulatory Visit (HOSPITAL_COMMUNITY): Payer: Self-pay | Admitting: Diagnostic Radiology

## 2016-07-12 ENCOUNTER — Ambulatory Visit
Admission: RE | Admit: 2016-07-12 | Discharge: 2016-07-12 | Disposition: A | Discharge status: Home / Self Care | Payer: Medicare Other | Source: Ambulatory Visit | Attending: Oncology | Admitting: Oncology

## 2016-07-12 ENCOUNTER — Other Ambulatory Visit: Payer: Self-pay | Admitting: Oncology

## 2016-07-12 DIAGNOSIS — K8021 Calculus of gallbladder without cholecystitis with obstruction: Secondary | ICD-10-CM | POA: Diagnosis not present

## 2016-07-12 DIAGNOSIS — R188 Other ascites: Secondary | ICD-10-CM | POA: Diagnosis not present

## 2016-07-12 DIAGNOSIS — C787 Secondary malignant neoplasm of liver and intrahepatic bile duct: Secondary | ICD-10-CM

## 2016-07-12 DIAGNOSIS — Z79899 Other long term (current) drug therapy: Secondary | ICD-10-CM | POA: Insufficient documentation

## 2016-07-12 DIAGNOSIS — Z9889 Other specified postprocedural states: Secondary | ICD-10-CM | POA: Diagnosis not present

## 2016-07-12 DIAGNOSIS — Z794 Long term (current) use of insulin: Secondary | ICD-10-CM | POA: Insufficient documentation

## 2016-07-12 DIAGNOSIS — K869 Disease of pancreas, unspecified: Secondary | ICD-10-CM | POA: Insufficient documentation

## 2016-07-12 DIAGNOSIS — R918 Other nonspecific abnormal finding of lung field: Secondary | ICD-10-CM | POA: Insufficient documentation

## 2016-07-12 DIAGNOSIS — K8689 Other specified diseases of pancreas: Secondary | ICD-10-CM

## 2016-07-12 DIAGNOSIS — I700 Atherosclerosis of aorta: Secondary | ICD-10-CM | POA: Insufficient documentation

## 2016-07-12 DIAGNOSIS — C259 Malignant neoplasm of pancreas, unspecified: Secondary | ICD-10-CM | POA: Diagnosis not present

## 2016-07-12 DIAGNOSIS — E119 Type 2 diabetes mellitus without complications: Secondary | ICD-10-CM | POA: Diagnosis not present

## 2016-07-12 LAB — CBC
HEMATOCRIT: 35.4 % — AB (ref 40.0–52.0)
HEMOGLOBIN: 12.7 g/dL — AB (ref 13.0–18.0)
MCH: 31.5 pg (ref 26.0–34.0)
MCHC: 36 g/dL (ref 32.0–36.0)
MCV: 87.6 fL (ref 80.0–100.0)
Platelets: 317 10*3/uL (ref 150–440)
RBC: 4.04 MIL/uL — AB (ref 4.40–5.90)
RDW: 17.2 % — ABNORMAL HIGH (ref 11.5–14.5)
WBC: 10.1 10*3/uL (ref 3.8–10.6)

## 2016-07-12 LAB — COMPREHENSIVE METABOLIC PANEL
ALBUMIN: 2.1 g/dL — AB (ref 3.5–5.0)
ALT: 133 U/L — AB (ref 17–63)
AST: 199 U/L — AB (ref 15–41)
Alkaline Phosphatase: 759 U/L — ABNORMAL HIGH (ref 38–126)
Anion gap: 9 (ref 5–15)
BILIRUBIN TOTAL: 31.1 mg/dL — AB (ref 0.3–1.2)
BUN: 58 mg/dL — AB (ref 6–20)
CO2: 21 mmol/L — ABNORMAL LOW (ref 22–32)
Calcium: 8.7 mg/dL — ABNORMAL LOW (ref 8.9–10.3)
Chloride: 103 mmol/L (ref 101–111)
GLUCOSE: 84 mg/dL (ref 65–99)
POTASSIUM: 4.1 mmol/L (ref 3.5–5.1)
Sodium: 133 mmol/L — ABNORMAL LOW (ref 135–145)
TOTAL PROTEIN: 6.3 g/dL — AB (ref 6.5–8.1)

## 2016-07-12 LAB — PROTIME-INR
INR: 1.5
PROTHROMBIN TIME: 18.3 s — AB (ref 11.4–15.2)

## 2016-07-12 LAB — APTT: aPTT: 33 seconds (ref 24–36)

## 2016-07-12 LAB — GLUCOSE, CAPILLARY
GLUCOSE-CAPILLARY: 131 mg/dL — AB (ref 65–99)
Glucose-Capillary: 74 mg/dL (ref 65–99)
Glucose-Capillary: 81 mg/dL (ref 65–99)

## 2016-07-12 MED ORDER — MIDAZOLAM HCL 5 MG/5ML IJ SOLN
INTRAMUSCULAR | Status: AC | PRN
  Administered 2016-07-12: 1 mg via INTRAVENOUS

## 2016-07-12 MED ORDER — OXYCODONE HCL 10 MG PO TABS: 10.0000 mg | ORAL_TABLET | ORAL | 0 refills | Status: AC | PRN

## 2016-07-12 MED ORDER — OXYCODONE HCL 5 MG PO TABS
5.0000 mg | ORAL_TABLET | ORAL | Status: DC | PRN
  Filled 2016-07-12: qty 1

## 2016-07-12 MED ORDER — SODIUM CHLORIDE 0.9 % IV SOLN
INTRAVENOUS | Status: DC
  Administered 2016-07-12: 09:00:00 via INTRAVENOUS

## 2016-07-12 MED ORDER — MIDAZOLAM HCL 5 MG/5ML IJ SOLN
INTRAMUSCULAR | Status: AC
  Filled 2016-07-12: qty 5

## 2016-07-12 MED ORDER — FENTANYL CITRATE (PF) 100 MCG/2ML IJ SOLN
INTRAMUSCULAR | Status: AC | PRN
  Administered 2016-07-12: 50 ug via INTRAVENOUS

## 2016-07-12 MED ORDER — FENTANYL CITRATE (PF) 100 MCG/2ML IJ SOLN
INTRAMUSCULAR | Status: AC
  Filled 2016-07-12: qty 4

## 2016-07-12 NOTE — Progress Notes (Signed)
FSBS-81. Pt. Eating cookies.

## 2016-07-12 NOTE — Telephone Encounter (Signed)
Per VO Dr Grayland Ormond , Oxycodone 10 mg q 4 h prn #60. Bethena Roys notified come and pick up paper Rx

## 2016-07-12 NOTE — Progress Notes (Signed)
FSBS-74. Pt. Eating a cup of ice cream.  Apple juice was given at 1000. Will recheck in 30 min. Post-eating.

## 2016-07-12 NOTE — Telephone Encounter (Signed)
Called requesting pain medication be ordered for his lower abdominal pain he has every time he moves. It is keeping him awake at night and he is unable to sleep. States they have tried Tylenol and Xanax, but they are not relieving pain. States he is getting ready for a biopsy also. Please advise

## 2016-07-12 NOTE — Procedures (Signed)
  Pre-operative Diagnosis: Metastatic pancreatic cancer       Post-operative Diagnosis: Metastatic pancreatic cancer   Indications: Needs tissue diagnosis  Procedure: Paracentesis and liver lesion biopsy  Findings: Small amount of perihepatic ascites.  Liver lesions.  Removed 100 ml of amber colored ascites. 2 cores obtained from right hepatic lesion.    Complications: no immediate      EBL: Minimal  Plan: Bedrest 3 hours.

## 2016-07-12 NOTE — Progress Notes (Signed)
FSBS 131
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
## 2016-07-13 LAB — CYTOLOGY - NON PAP

## 2016-07-13 LAB — SURGICAL PATHOLOGY

## 2016-07-16 ENCOUNTER — Inpatient Hospital Stay: Payer: Medicare Other

## 2016-07-16 ENCOUNTER — Telehealth: Payer: Self-pay

## 2016-07-16 ENCOUNTER — Inpatient Hospital Stay (HOSPITAL_BASED_OUTPATIENT_CLINIC_OR_DEPARTMENT_OTHER): Payer: Medicare Other | Admitting: Oncology

## 2016-07-16 ENCOUNTER — Inpatient Hospital Stay: Payer: Medicare Other | Admitting: Oncology

## 2016-07-16 ENCOUNTER — Other Ambulatory Visit: Payer: Self-pay

## 2016-07-16 DIAGNOSIS — Z79899 Other long term (current) drug therapy: Secondary | ICD-10-CM | POA: Diagnosis not present

## 2016-07-16 DIAGNOSIS — C250 Malignant neoplasm of head of pancreas: Secondary | ICD-10-CM

## 2016-07-16 DIAGNOSIS — K8689 Other specified diseases of pancreas: Secondary | ICD-10-CM

## 2016-07-16 DIAGNOSIS — R110 Nausea: Secondary | ICD-10-CM

## 2016-07-16 DIAGNOSIS — C779 Secondary and unspecified malignant neoplasm of lymph node, unspecified: Secondary | ICD-10-CM

## 2016-07-16 DIAGNOSIS — C787 Secondary malignant neoplasm of liver and intrahepatic bile duct: Secondary | ICD-10-CM

## 2016-07-16 DIAGNOSIS — C7800 Secondary malignant neoplasm of unspecified lung: Secondary | ICD-10-CM

## 2016-07-16 DIAGNOSIS — C252 Malignant neoplasm of tail of pancreas: Secondary | ICD-10-CM | POA: Diagnosis not present

## 2016-07-16 MED ORDER — ONDANSETRON HCL 4 MG/2ML IJ SOLN
8.0000 mg | Freq: Once | INTRAMUSCULAR | Status: AC
  Administered 2016-07-16: 8 mg via INTRAVENOUS
  Filled 2016-07-16: qty 4

## 2016-07-16 MED ORDER — SODIUM CHLORIDE 0.9 % IV SOLN
INTRAVENOUS | Status: AC
  Administered 2016-07-16: 15:00:00 via INTRAVENOUS
  Filled 2016-07-16 (×2): qty 1000

## 2016-07-16 MED ORDER — SODIUM CHLORIDE 0.9 % IV SOLN: 10.0000 mg | Freq: Once | INTRAVENOUS | Status: DC

## 2016-07-16 MED ORDER — DEXAMETHASONE SODIUM PHOSPHATE 10 MG/ML IJ SOLN
10.0000 mg | Freq: Once | INTRAMUSCULAR | Status: AC
  Administered 2016-07-16: 10 mg via INTRAVENOUS
  Filled 2016-07-16: qty 1

## 2016-07-16 MED ORDER — ONDANSETRON HCL 4 MG PO TABS
ORAL_TABLET | ORAL | Status: AC
  Filled 2016-07-16: qty 1

## 2016-07-16 MED ORDER — SODIUM CHLORIDE 0.9 % IV SOLN: Freq: Once | INTRAVENOUS | Status: DC

## 2016-07-16 MED ORDER — ONDANSETRON HCL 4 MG PO TABS
8.0000 mg | ORAL_TABLET | Freq: Once | ORAL | Status: AC
  Administered 2016-07-16: 8 mg via ORAL
  Filled 2016-07-16: qty 2

## 2016-07-16 MED ORDER — FENTANYL 25 MCG/HR TD PT72
25.0000 ug | MEDICATED_PATCH | TRANSDERMAL | 0 refills | Status: AC
Start: 2016-07-16 — End: ?

## 2016-07-16 NOTE — Telephone Encounter (Signed)
  Oncology Nurse Navigator Documentation Received call from [place], significant other, with reports of decreased weakness over the weekend. He is able to use a walker to get to the bathroom and back with assistance. Very little intake of fluids or food. She felt that Oxycodone was making him more nauseated. Per Dr. Grayland Ormond he can start Duragesic patch 8mcg. He has been taking both antiemetics he has at home. Once he stopped the oxycodone his nausea has improved and he was able to tolerate a little oral intake this am. Dr. Grayland Ormond will see as acute add on today and possible IVF. He has also voiced that he may not want to initiate his chemotherapy tomorrow. Informed Bethena Roys that Dr. Grayland Ormond would talk with them more regarding his treatment and initiating hospice as they desire. Navigator Location: [facility] (07/16/16 1100)   )Navigator Encounter Type: Telephone (07/16/16 1100)                                                    Time Spent with Patient: 30 (07/16/16 1100)

## 2016-07-16 NOTE — Progress Notes (Unsigned)
Patient given po zofran, unable to keep medication down, IV zofran ordered.

## 2016-07-16 NOTE — Progress Notes (Signed)
Patient reports abdominal pain today, he is unable to tolerate oxycodone due to nausea.

## 2016-07-17 ENCOUNTER — Inpatient Hospital Stay: Payer: Medicare Other | Admitting: Oncology

## 2016-07-17 ENCOUNTER — Inpatient Hospital Stay: Payer: Medicare Other

## 2016-07-17 DIAGNOSIS — C259 Malignant neoplasm of pancreas, unspecified: Secondary | ICD-10-CM | POA: Insufficient documentation
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
## 2016-07-18 ENCOUNTER — Telehealth: Payer: Self-pay | Admitting: *Deleted

## 2016-07-18 NOTE — Telephone Encounter (Signed)
[facility] Swanson called stating that hospice has not received the referral for services. Please fax referral ASAP. I confirmed with hospice that a referral has not been faxed

## 2016-07-18 NOTE — Telephone Encounter (Signed)
Had to wait on finnegan to finnish the note and include note about hospice. Faxed over to Hospice just now.

## 2016-07-22 DEATH — deceased

## 2016-12-26 ENCOUNTER — Other Ambulatory Visit: Payer: Self-pay | Admitting: Nurse Practitioner

## 2018-02-01 IMAGING — CT NM PET TUM IMG INITIAL (PI) SKULL BASE T - THIGH
11 series · 19 of 25 positions shown · non-contrast
Comparison: 06/22/2016

CLINICAL DATA: Initial treatment strategy for pancreas cancer.

EXAM:
NUCLEAR MEDICINE PET SKULL BASE TO THIGH
TECHNIQUE: 12.97 mCi F-18 FDG was injected intravenously. Full-ring PET imaging
was performed from the skull base to thigh after the radiotracer. CT
data was obtained and used for attenuation correction and anatomic
localization.
FASTING BLOOD GLUCOSE:  Value: 160 mg/dl

[Series 3: ct wb 5.0 b30f · axial · 5.0mm · 0.98mm/px · z∈[-1686,-1138]mm · 2 of 368 slices shown]
[im 1/368]
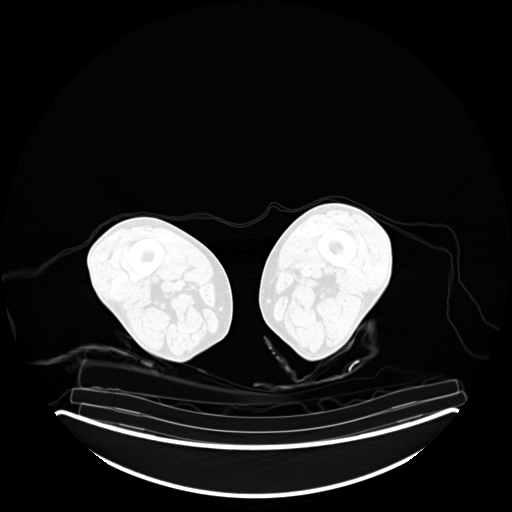
[im 184/368]
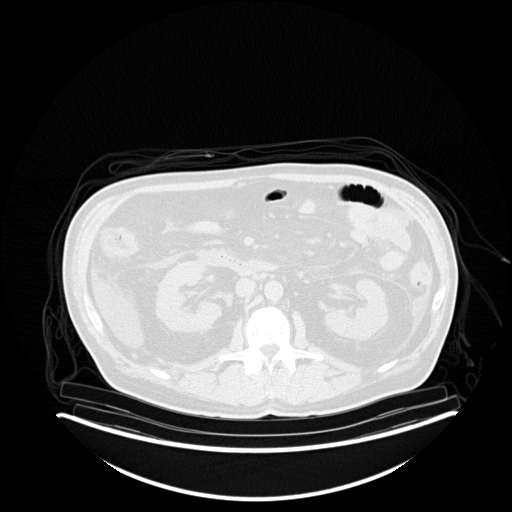

[Series 4: pet wb (ac) · axial · 5.0mm · 2.55mm/px · z∈[-1686,-586]mm · 3 of 368 slices shown]
[im 1/368]
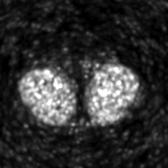
[im 184/368]
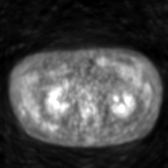
[im 368/368]
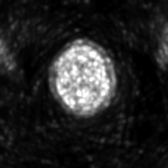

[Series 5: pet wb uncorrected (nac) · axial · 5.0mm · 4.07mm/px · z∈[-1138,-586]mm · 2 of 368 slices shown]
[im 184/368]
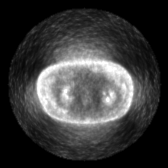
[im 368/368]
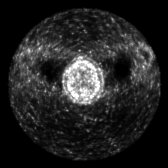

[Series 603: pet axial fused · 3 of 367 slices shown]
[im 1/367]
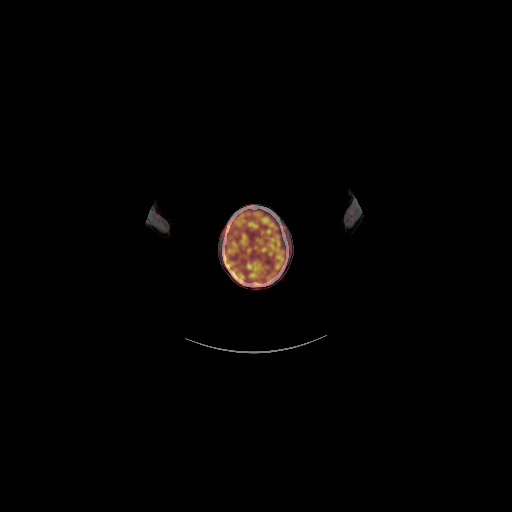
[im 245/367]
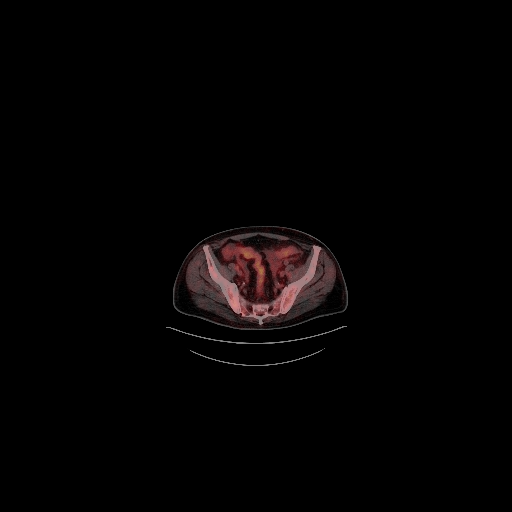
[im 367/367]
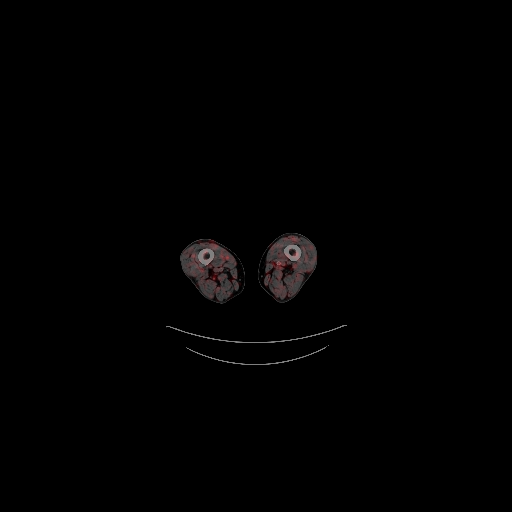

[Series 604: pet coronal fused · 1 of 85 slices shown]
[im 1/85]
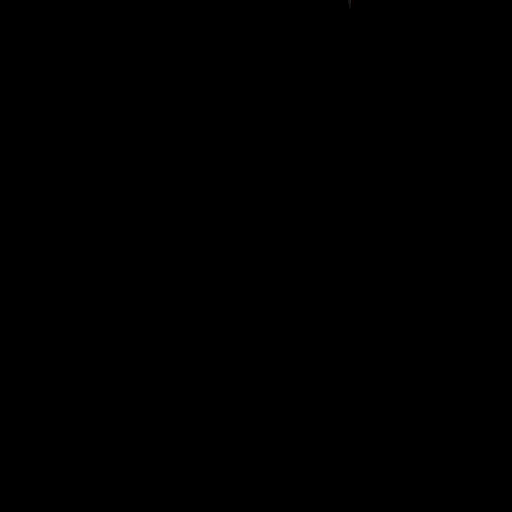

[Series 605: pet sagittal fused · 1 of 156 slices shown]
[im 156/156]
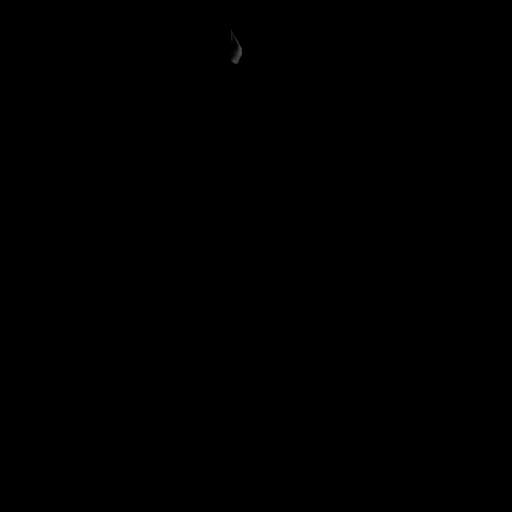

[Series 606: pet axial · 3 of 366 slices shown]
[im 1/366]
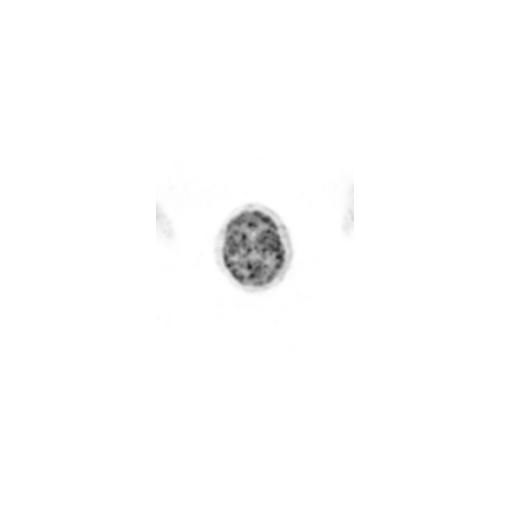
[im 122/366]
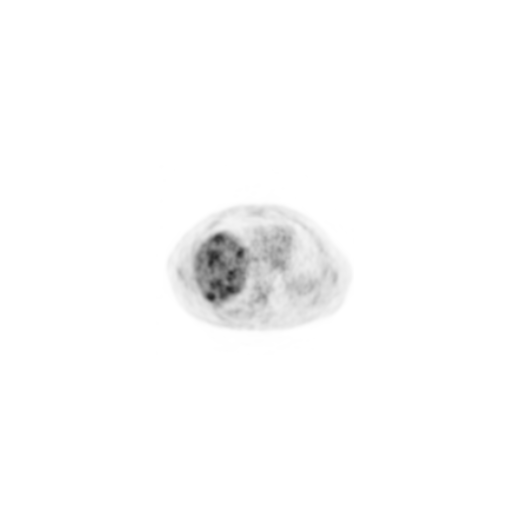
[im 366/366]
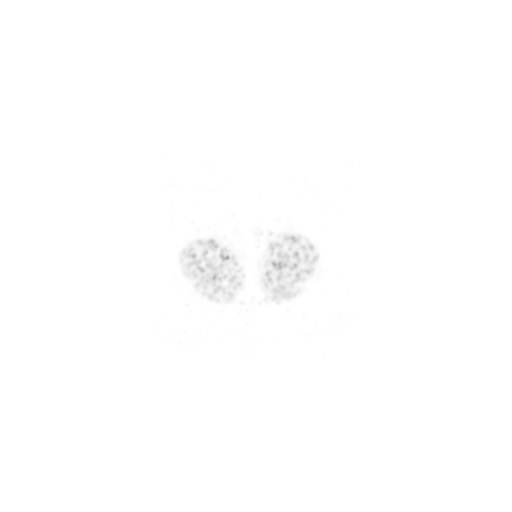

[Series 607: pet coronal · 1 of 105 slices shown]
[im 1/105]
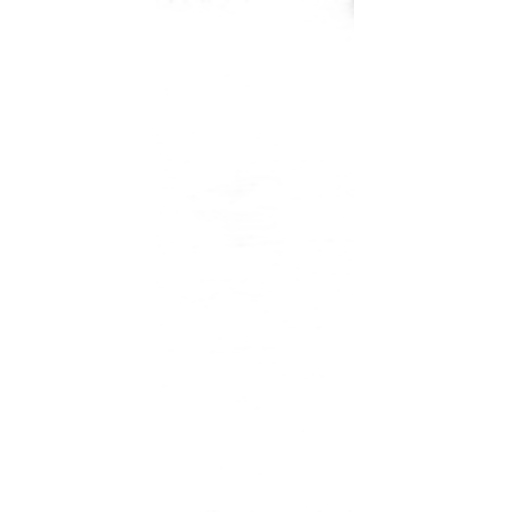

[Series 608: pet sagittal · 1 of 142 slices shown]
[im 1/142]
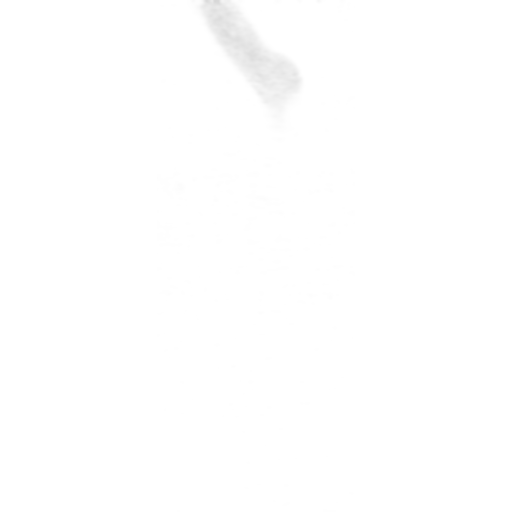

[Series 1038: results mm oncology reading · 0.50mm/px · 1 of 6 slices shown (1 of 2)]
[im 1/6]
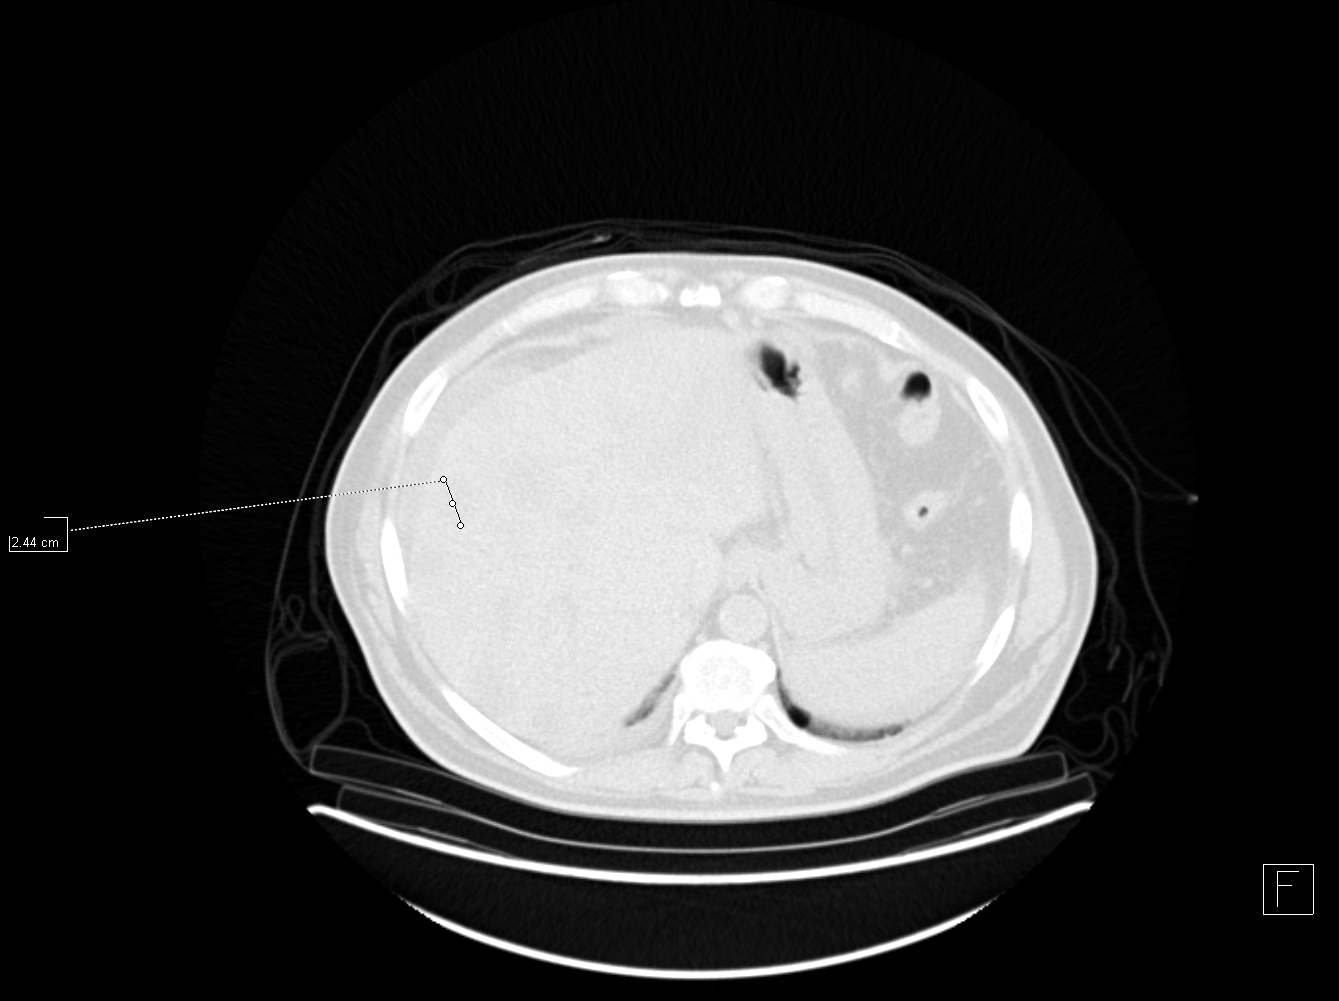

[Series 1040: results mm oncology reading · 1.00mm/px · 1 of 5 slices shown (2 of 2)]
[im 1/5]
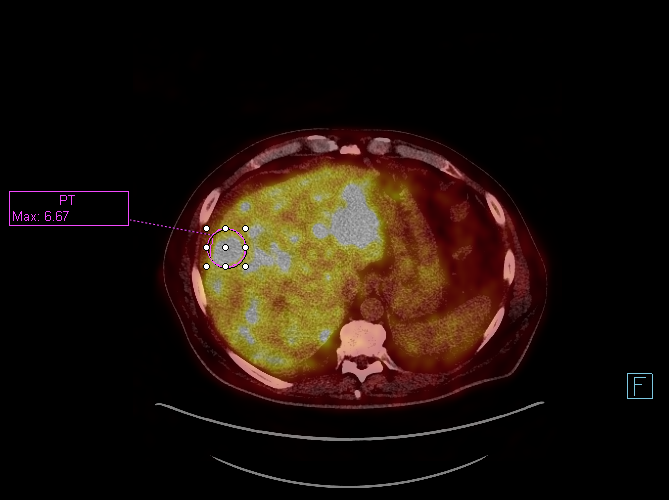

[19 of 25 positions shown; findings below may reference images not displayed]

FINDINGS: NECK

No hypermetabolic lymph nodes in the neck.

CHEST

No hypermetabolic mediastinal or hilar nodes. Lad coronary artery
calcification noted. Aortic atherosclerosis. Small right pleural
effusion/thickening multiple small pulmonary nodules are identified
scattered throughout both lungs. These are too small to characterize
by PET-CT but are suspicious for metastatic disease. Index nodule in
the left upper lobe measures 7 mm, image 90 of series 3. Index right
middle lobe lung nodule measures 7 mm, image 112 of series 3. No
suspicious pulmonary nodules on the CT scan.

ABDOMEN/PELVIS

Numerous low-attenuation lesions are identified throughout the liver
compatible with widespread metastatic disease. Index lesion within
segment 7 measures 2.44 cm and has an SUV max equal to 6.7. Left
lobe of liver mass measures 6.9 cm and has an SUV max equal to
lesion within segment 6 measures 3.6 cm and has an SUV max equal to
6.38.

The lesion within tail of pancreas is again noted. This measures 2
cm and has an SUV max equal to 6.38.

The spleen appears enlarged measuring 19 cm in length. No focal
splenic hypermetabolism however.

Hypermetabolic portacaval node measures 1.5 cm and has an SUV max
equal to 5.37. Small volume of ascites identified within the upper
abdomen and pelvis.

SKELETON

No focal hypermetabolic activity to suggest skeletal metastasis.
IMPRESSION: 1. Examination is positive for hypermetabolic lesion within tail of
pancreas. This is suspicious for primary adenocarcinoma of the
pancreas.
2. Evidence of upper abdominal lymph node metastasis and extensive
liver metastasis.
3. Multiple small pulmonary nodules scattered throughout both lungs
are worrisome for pulmonary metastasis
4. Ascites.
5. Aortic Atherosclerosis ([id number]). LAD coronary artery
calcifications noted.

## 2018-11-14 IMAGING — US US BIOPSY
1 series · 12 of 12 positions shown · non-contrast
Comparison: none

INDICATION: 66-year-old with presumed metastatic pancreatic cancer. Patient
needs a tissue diagnosis. Patient is extremely jaundice with
worsening bilirubin level.

[Series 1: us biopsy · 0.13mm/px · 12 of 12 slices shown]
[im 1/12]
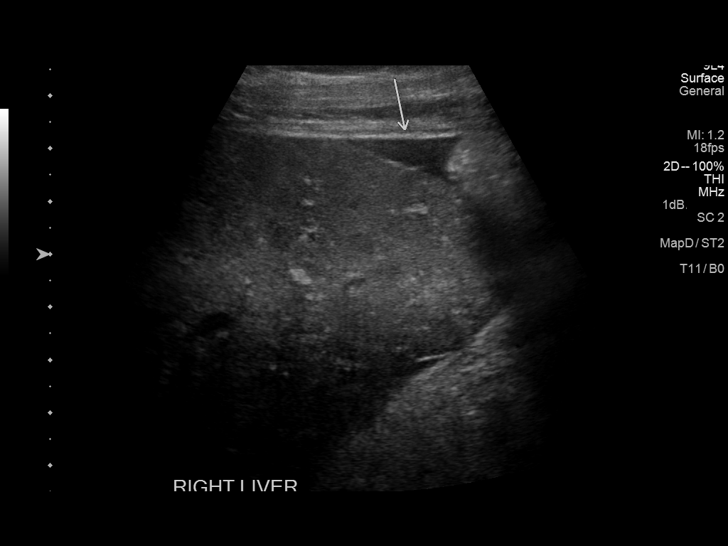
[im 2/12]
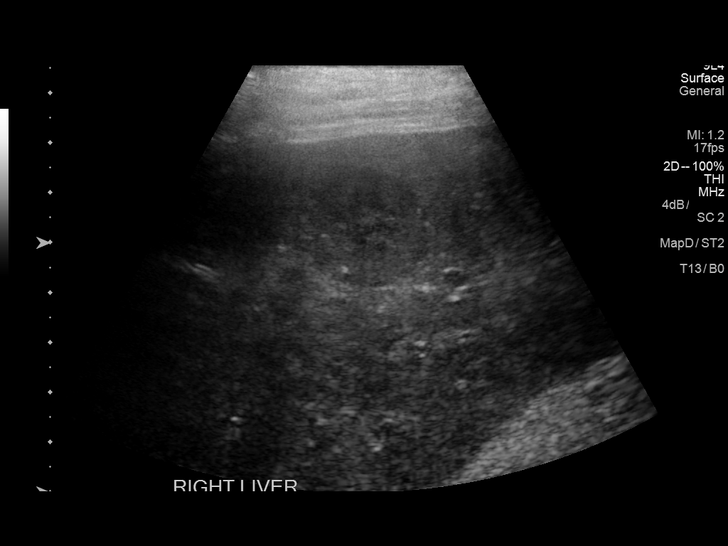
[im 3/12]
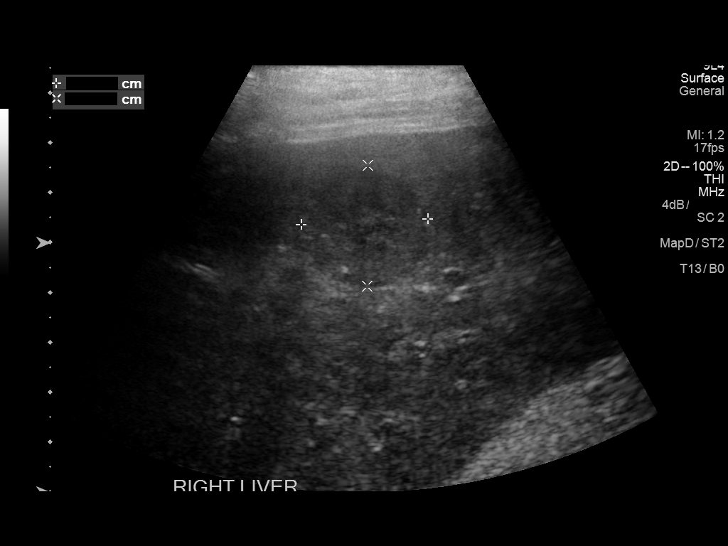
[im 4/12]
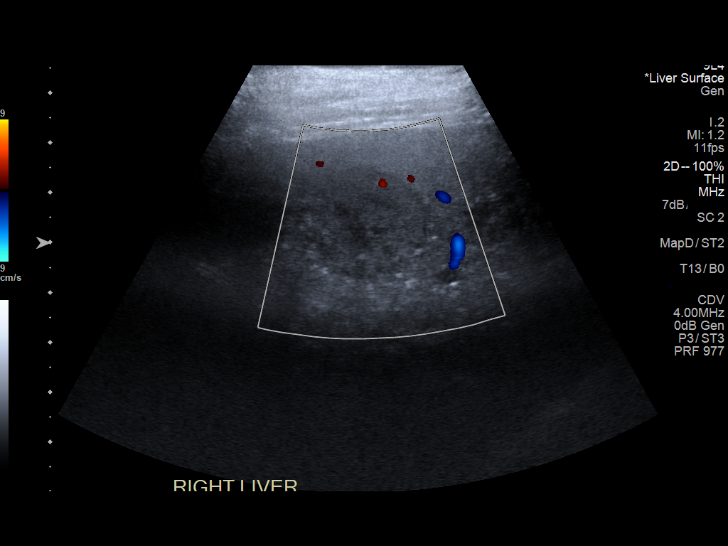
[im 5/12]
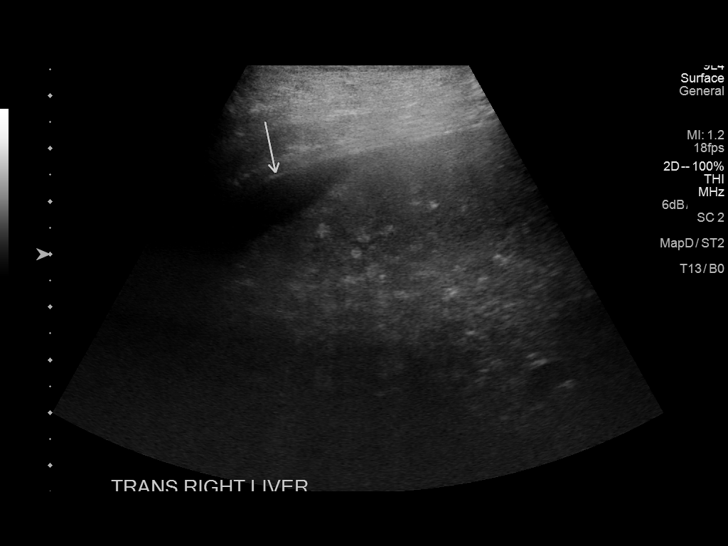
[im 6/12]
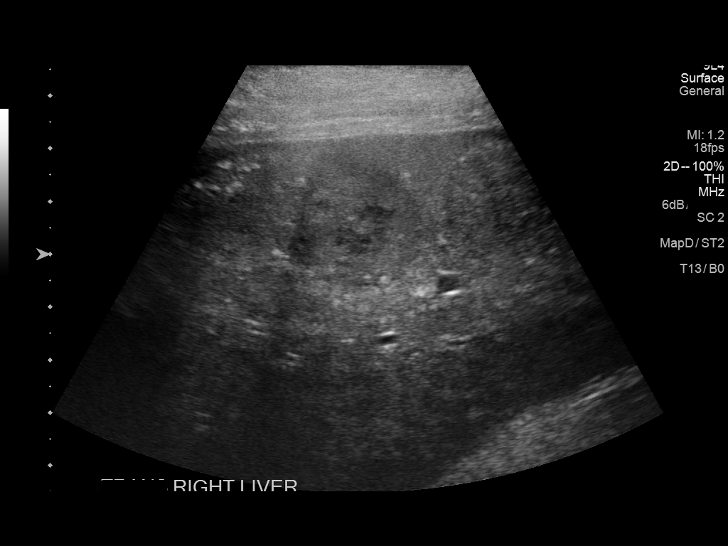
[im 7/12]
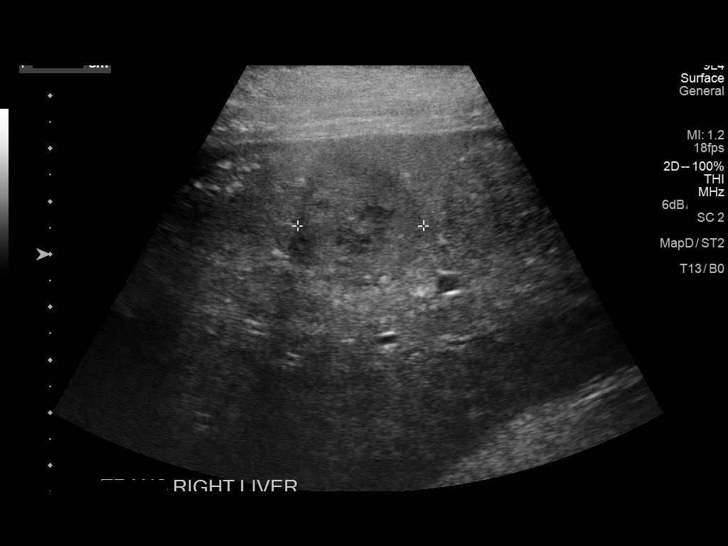
[im 8/12]
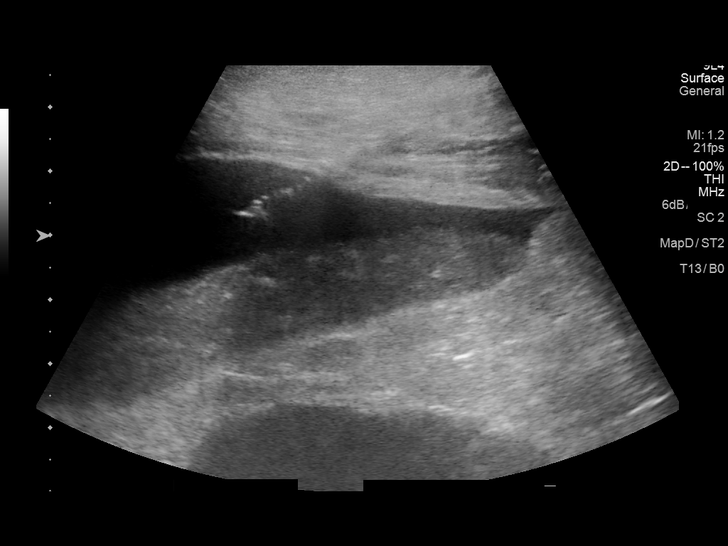
[im 9/12]
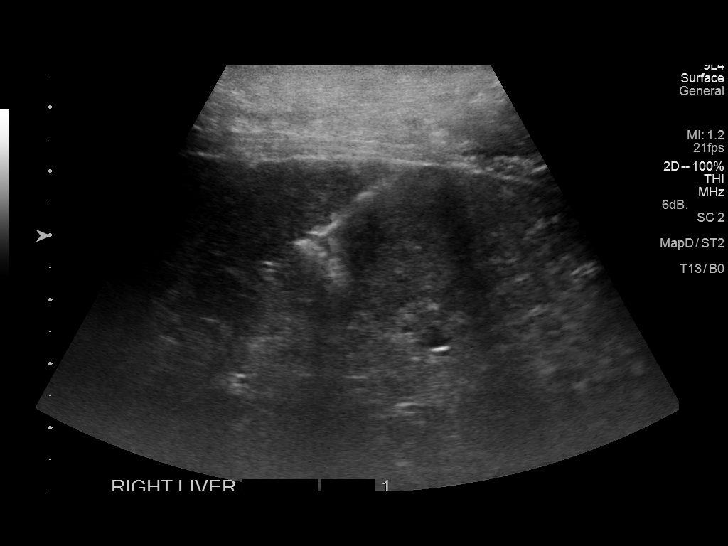
[im 10/12]
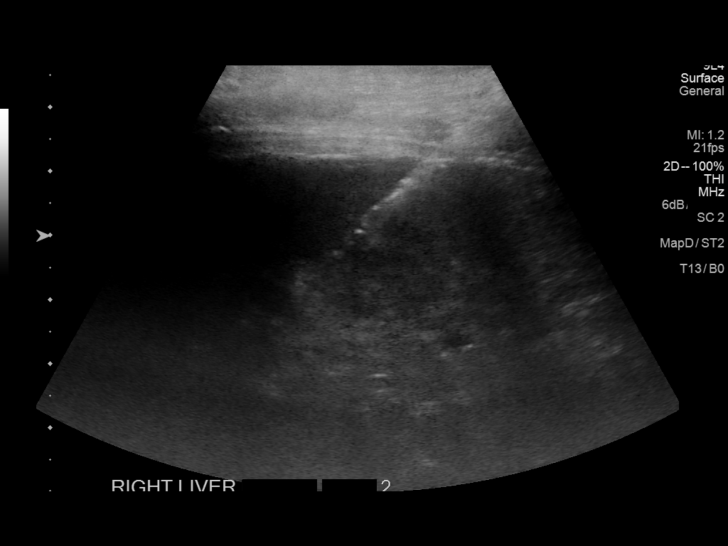
[im 11/12]
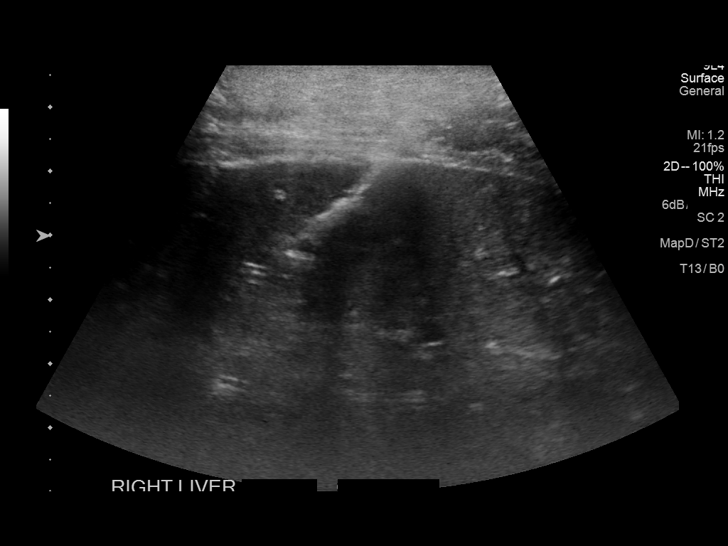
[im 12/12]
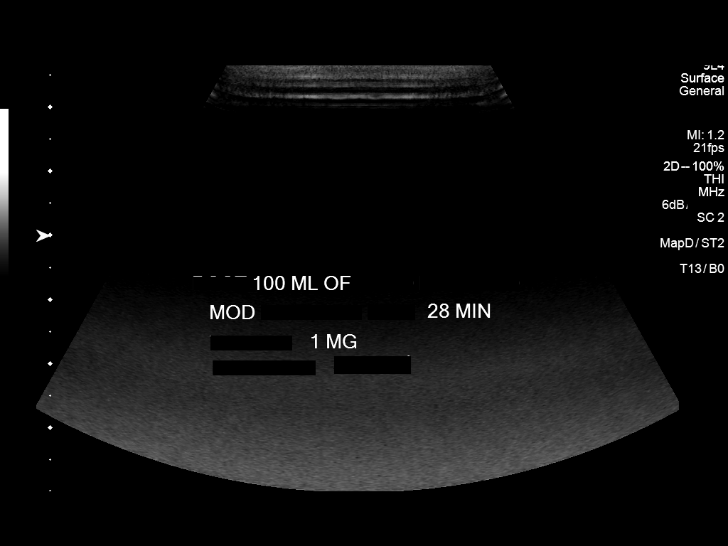

[12 of 12 positions shown; findings below may reference images not displayed]

EXAM:
ULTRASOUND-GUIDED LIVER LESION BIOPSY

ULTRASOUND-GUIDED PARACENTESIS

MEDICATIONS:
None.

ANESTHESIA/SEDATION:
Moderate (conscious) sedation was employed during this procedure. A
total of Versed 1.0 mg and Fentanyl 50 mcg was administered
intravenously.

Moderate Sedation Time: 28 minutes. The patient's level of
consciousness and vital signs were monitored continuously by
radiology nursing throughout the procedure under my direct
supervision.

FLUOROSCOPY TIME:  None

COMPLICATIONS:
None immediate.

PROCEDURE:
Informed written consent was obtained from the patient after a
thorough discussion of the procedural risks, benefits and
alternatives. In particular, the increased risk of bleeding based on
the ascites and laboratory values were discussed with the patient.
All questions were addressed. A timeout was performed prior to the
initiation of the procedure.

Patient was rolled onto his left side. Small amount of perihepatic
ascites was identified. A suitable lesion in the right hepatic lobe
was identified. Right side of the abdomen was prepped with
chlorhexidine and a sterile field was created. Skin was anesthetized
with 1% lidocaine. 19 gauge Yueh catheter was directed into the
perihepatic ascites with ultrasound guidance and 100 mL of amber
colored fluid was removed prior to the liver biopsy.

Attention was directed to the liver biopsy. Skin was anesthetized
cephalad to the paracentesis catheter. Small incision was made and a
17 gauge coaxial needle was directed into the right hepatic lobe
with ultrasound guidance. Needle was positioned within the lesion.
Two core biopsies were obtained with an 18 gauge device. Specimens
placed in formalin. The biopsy tract was embolized with Gel-Foam
slurry. 17 gauge needle was removed without complication. No
significant bleeding or hematoma formation following the core
biopsies. Bandages placed at the puncture sites.
FINDINGS: Small amount of perihepatic ascites. 100 mL of amber colored fluid
was removed prior to the liver biopsy.

Liver is diffusely heterogeneous with subtle lesions. Multiple
echogenic foci within the liver may be related to metastatic disease
or could represent biliary air from the recent ERCP. Biopsy needle
was confirmed within a right hepatic lesion. Gel-Foam identified
within the biopsy track at the end of the procedure.
IMPRESSION: Successful ultrasound-guided core biopsy of a right hepatic lesion.

Successful ultrasound-guided paracentesis.
